# Patient Record
Sex: Female | Born: 1975 | Race: White | Hispanic: No | Marital: Single | Smoking: Former smoker
Health system: Southern US, Community
[De-identification: ages and names within clinical notes are randomized; demographics above are authoritative.]

## PROBLEM LIST (undated history)

## (undated) DIAGNOSIS — K219 Gastro-esophageal reflux disease without esophagitis: Secondary | ICD-10-CM

## (undated) DIAGNOSIS — Z8669 Personal history of other diseases of the nervous system and sense organs: Secondary | ICD-10-CM

## (undated) DIAGNOSIS — T7840XA Allergy, unspecified, initial encounter: Secondary | ICD-10-CM

## (undated) DIAGNOSIS — Z8619 Personal history of other infectious and parasitic diseases: Secondary | ICD-10-CM

## (undated) HISTORY — DX: Personal history of other infectious and parasitic diseases: Z86.19

## (undated) HISTORY — DX: Personal history of other diseases of the nervous system and sense organs: Z86.69

## (undated) HISTORY — DX: Gastro-esophageal reflux disease without esophagitis: K21.9

## (undated) HISTORY — PX: WISDOM TOOTH EXTRACTION: SHX21

## (undated) HISTORY — DX: Allergy, unspecified, initial encounter: T78.40XA

---

## 1999-08-02 ENCOUNTER — Other Ambulatory Visit: Admission: RE | Admit: 1999-08-02 | Discharge: 1999-08-02 | Payer: Self-pay | Admitting: Obstetrics and Gynecology

## 2000-09-06 ENCOUNTER — Other Ambulatory Visit: Admission: RE | Admit: 2000-09-06 | Discharge: 2000-09-06 | Payer: Self-pay | Admitting: Obstetrics and Gynecology

## 2001-10-17 ENCOUNTER — Other Ambulatory Visit: Admission: RE | Admit: 2001-10-17 | Discharge: 2001-10-17 | Payer: Self-pay | Admitting: Obstetrics and Gynecology

## 2003-01-02 ENCOUNTER — Other Ambulatory Visit: Admission: RE | Admit: 2003-01-02 | Discharge: 2003-01-02 | Payer: Self-pay | Admitting: Obstetrics and Gynecology

## 2004-02-03 ENCOUNTER — Other Ambulatory Visit: Admission: RE | Admit: 2004-02-03 | Discharge: 2004-02-03 | Payer: Self-pay | Admitting: Obstetrics and Gynecology

## 2005-10-25 ENCOUNTER — Observation Stay: Payer: Self-pay | Admitting: Obstetrics and Gynecology

## 2006-01-04 ENCOUNTER — Observation Stay: Payer: Self-pay | Admitting: Obstetrics and Gynecology

## 2006-01-08 ENCOUNTER — Observation Stay: Payer: Self-pay | Admitting: Obstetrics and Gynecology

## 2006-01-10 ENCOUNTER — Inpatient Hospital Stay: Payer: Self-pay

## 2006-03-10 ENCOUNTER — Emergency Department: Payer: Self-pay | Admitting: Emergency Medicine

## 2007-08-18 ENCOUNTER — Emergency Department: Payer: Self-pay | Admitting: Internal Medicine

## 2007-08-21 ENCOUNTER — Emergency Department: Payer: Self-pay | Admitting: Emergency Medicine

## 2008-11-02 ENCOUNTER — Emergency Department: Payer: Self-pay | Admitting: Emergency Medicine

## 2010-03-15 ENCOUNTER — Emergency Department: Payer: Self-pay | Admitting: Emergency Medicine

## 2012-08-19 LAB — LIPID PANEL
CHOLESTEROL: 154 (ref 0–200)
HDL: 47 (ref 35–70)
LDL CALC: 78
LDl/HDL Ratio: 1.7
Triglycerides: 146 (ref 40–160)

## 2013-07-02 ENCOUNTER — Emergency Department: Payer: Self-pay | Admitting: Emergency Medicine

## 2013-11-13 ENCOUNTER — Ambulatory Visit: Payer: Self-pay | Admitting: Obstetrics and Gynecology

## 2014-03-21 ENCOUNTER — Emergency Department: Payer: Self-pay | Admitting: Emergency Medicine

## 2014-03-21 LAB — URINALYSIS, COMPLETE
Bilirubin,UR: NEGATIVE
Glucose,UR: NEGATIVE mg/dL (ref 0–75)
KETONE: NEGATIVE
Leukocyte Esterase: NEGATIVE
Nitrite: NEGATIVE
Ph: 6 (ref 4.5–8.0)
Protein: NEGATIVE
RBC,UR: 5 /HPF (ref 0–5)
Specific Gravity: 1.012 (ref 1.003–1.030)
Squamous Epithelial: 2
WBC UR: 1 /HPF (ref 0–5)

## 2014-03-21 LAB — COMPREHENSIVE METABOLIC PANEL
ALBUMIN: 3.1 g/dL — AB (ref 3.4–5.0)
ALK PHOS: 61 U/L
Anion Gap: 7 (ref 7–16)
BILIRUBIN TOTAL: 0.6 mg/dL (ref 0.2–1.0)
BUN: 11 mg/dL (ref 7–18)
CHLORIDE: 107 mmol/L (ref 98–107)
Calcium, Total: 8.1 mg/dL — ABNORMAL LOW (ref 8.5–10.1)
Co2: 26 mmol/L (ref 21–32)
Creatinine: 0.72 mg/dL (ref 0.60–1.30)
EGFR (African American): >60
GLUCOSE: 83 mg/dL (ref 65–99)
Osmolality: 278 (ref 275–301)
POTASSIUM: 3.9 mmol/L (ref 3.5–5.1)
SGOT(AST): 18 U/L (ref 15–37)
SGPT (ALT): 17 U/L
Sodium: 140 mmol/L (ref 136–145)
TOTAL PROTEIN: 7.1 g/dL (ref 6.4–8.2)

## 2014-03-21 LAB — CBC WITH DIFFERENTIAL/PLATELET
Basophil #: 0 10*3/uL (ref 0.0–0.1)
Basophil %: 0.4 %
EOS ABS: 0 10*3/uL (ref 0.0–0.7)
EOS PCT: 0.3 %
HCT: 37.3 % (ref 35.0–47.0)
HGB: 12.1 g/dL (ref 12.0–16.0)
LYMPHS PCT: 13 %
Lymphocyte #: 1.4 10*3/uL (ref 1.0–3.6)
MCH: 25.7 pg — ABNORMAL LOW (ref 26.0–34.0)
MCHC: 32.4 g/dL (ref 32.0–36.0)
MCV: 79 fL — AB (ref 80–100)
Monocyte #: 0.9 x10 3/mm (ref 0.2–0.9)
Monocyte %: 8.2 %
Neutrophil #: 8.7 10*3/uL — ABNORMAL HIGH (ref 1.4–6.5)
Neutrophil %: 78.1 %
Platelet: 266 10*3/uL (ref 150–440)
RBC: 4.69 10*6/uL (ref 3.80–5.20)
RDW: 14.6 % — AB (ref 11.5–14.5)
WBC: 11.1 10*3/uL — AB (ref 3.6–11.0)

## 2014-03-21 LAB — HCG, QUANTITATIVE, PREGNANCY: Beta Hcg, Quant.: 131356 m[IU]/mL — ABNORMAL HIGH

## 2014-03-21 LAB — PREGNANCY, URINE: Pregnancy Test, Urine: POSITIVE m[IU]/mL

## 2015-02-21 ENCOUNTER — Emergency Department: Payer: BLUE CROSS/BLUE SHIELD

## 2015-02-21 ENCOUNTER — Emergency Department
Admission: EM | Admit: 2015-02-21 | Discharge: 2015-02-21 | Disposition: A | Discharge status: Home / Self Care | Payer: BLUE CROSS/BLUE SHIELD | Attending: Emergency Medicine | Admitting: Emergency Medicine

## 2015-02-21 ENCOUNTER — Encounter: Payer: Self-pay | Admitting: Emergency Medicine

## 2015-02-21 DIAGNOSIS — M545 Low back pain: Secondary | ICD-10-CM | POA: Diagnosis present

## 2015-02-21 DIAGNOSIS — M533 Sacrococcygeal disorders, not elsewhere classified: Secondary | ICD-10-CM | POA: Diagnosis not present

## 2015-02-21 DIAGNOSIS — Z3202 Encounter for pregnancy test, result negative: Secondary | ICD-10-CM | POA: Insufficient documentation

## 2015-02-21 LAB — POCT PREGNANCY, URINE: PREG TEST UR: NEGATIVE

## 2015-02-21 MED ORDER — KETOROLAC TROMETHAMINE 60 MG/2ML IM SOLN
60.0000 mg | Freq: Once | INTRAMUSCULAR | Status: AC
  Administered 2015-02-21: 60 mg via INTRAMUSCULAR
  Filled 2015-02-21: qty 2

## 2015-02-21 MED ORDER — METHOCARBAMOL 500 MG PO TABS: 500.0000 mg | ORAL_TABLET | Freq: Four times a day (QID) | ORAL | Status: DC | PRN

## 2015-02-21 MED ORDER — DICLOFENAC SODIUM 75 MG PO TBEC: 75.0000 mg | DELAYED_RELEASE_TABLET | Freq: Two times a day (BID) | ORAL | Status: DC

## 2015-02-21 NOTE — ED Notes (Signed)
Patient to ED with report of lower back pain worsening over the last couple of weeks. Patient reports injuring back about 2 years ago.

## 2015-02-21 NOTE — ED Provider Notes (Signed)
[facility] Emergency Department Provider Note  ____________________________________________  Time seen: Approximately 11:28 AM  I have reviewed the triage vital signs and the nursing notes.   HISTORY  Chief Complaint Back Pain    HPI Megan Sexton is a 39 y.o. female who presents for evaluation of low back pain worsening over the last couple weeks. Patient states that she injured approximately 2 years ago because never had x-rays. States pain is right at the top of the buttocks at the crack.  No past medical history on file.  There are no active problems to display for this patient.   No past surgical history on file.  Current Outpatient Rx  Name  Route  Sig  Dispense  Refill  . diclofenac (VOLTAREN) 75 MG EC tablet   Oral   Take 1 tablet (75 mg total) by mouth 2 (two) times daily.   60 tablet   0   . methocarbamol (ROBAXIN) 500 MG tablet   Oral   Take 1 tablet (500 mg total) by mouth every 6 (six) hours as needed for muscle spasms.   30 tablet   0     Allergies Codeine  No family history on file.  Social History Social History  Substance Use Topics  . Smoking status: Never Smoker   . Smokeless tobacco: Not on file  . Alcohol Use: No    Review of Systems Constitutional: No fever/chills Eyes: No visual changes. ENT: No sore throat. Cardiovascular: Denies chest pain. Respiratory: Denies shortness of breath. Gastrointestinal: No abdominal pain.  No nausea, no vomiting.  No diarrhea.  No constipation. Genitourinary: Negative for dysuria. Musculoskeletal: Positive for low back pain located around the sacrum coccyx area Skin: Negative for rash. Neurological: Negative for headaches, focal weakness or numbness.  10-point ROS otherwise negative.  ____________________________________________   PHYSICAL EXAM:  VITAL SIGNS: ED Triage Vitals  Enc Vitals Group     BP --      Pulse Rate 02/21/15 1057 92     Resp 02/21/15 1057  20     Temp 02/21/15 1057 98 F (36.7 C)     Temp Source 02/21/15 1057 Oral     SpO2 02/21/15 1057 100 %     Weight 02/21/15 1057 220 lb (99.791 kg)     Height 02/21/15 1057  (1.727 m)     Head Cir --      Peak Flow --      Pain Score 02/21/15 1100 8     Pain Loc --      Pain Edu? --      Excl. in GC? --     Constitutional: Alert and oriented. Well appearing and in no acute distress. Cardiovascular: Normal rate, regular rhythm. Grossly normal heart sounds.  Good peripheral circulation. Respiratory: Normal respiratory effort.  No retractions. Lungs CTAB. Gastrointestinal: Soft and nontender. No distention. No abdominal bruits. No CVA tenderness. Musculoskeletal: No lower extremity tenderness nor edema.  No joint effusions. Neurologic:  Normal speech and language. No gross focal neurologic deficits are appreciated. No gait instability. Skin:  Skin is warm, dry and intact. No rash noted. Psychiatric: Mood and affect are normal. Speech and behavior are normal.  ____________________________________________   LABS (all labs ordered are listed, but only abnormal results are displayed)  Labs Reviewed  POC URINE PREG, ED  POCT PREGNANCY, URINE    RADIOLOGY  Mild DJD negative for fracture dislocation or mass. ____________________________________________   PROCEDURES  Procedure(s) performed: None  Critical Care performed: No  ____________________________________________   INITIAL IMPRESSION / ASSESSMENT AND PLAN / ED COURSE  Pertinent labs & imaging results that were available during my care of the patient were reviewed by me and considered in my medical decision making (see chart for details).  Chronic low back pain with some radiation. Rx given for Robaxin and Voltaren. Patient to follow up with PCP for chronic pain management referral. ____________________________________________   FINAL CLINICAL IMPRESSION(S) / ED DIAGNOSES  Final diagnoses:  Sacral back  pain      Evangeline Dakin, PA-C 02/21/15 1422  Phineas Semen, MD 02/21/15 1453

## 2015-02-21 NOTE — Discharge Instructions (Signed)
Back Pain, Adult °Back pain is very common. The pain often gets better over time. The cause of back pain is usually not dangerous. Most people can learn to manage their back pain on their own.  °HOME CARE  °· Stay active. Start with short walks on flat ground if you can. Try to walk farther each day. °· Do not sit, drive, or stand in one place for more than 30 minutes. Do not stay in bed. °· Do not avoid exercise or work. Activity can help your back heal faster. °· Be careful when you bend or lift an object. Bend at your knees, keep the object close to you, and do not twist. °· Sleep on a firm mattress. Lie on your side, and bend your knees. If you lie on your back, put a pillow under your knees. °· Only take medicines as told by your doctor. °· Put ice on the injured area. °¨ Put ice in a plastic bag. °¨ Place a towel between your skin and the bag. °¨ Leave the ice on for 15-20 minutes, 03-04 times a day for the first 2 to 3 days. After that, you can switch between ice and heat packs. °· Ask your doctor about back exercises or massage. °· Avoid feeling anxious or stressed. Find good ways to deal with stress, such as exercise. °GET HELP RIGHT AWAY IF:  °· Your pain does not go away with rest or medicine. °· Your pain does not go away in 1 week. °· You have new problems. °· You do not feel well. °· The pain spreads into your legs. °· You cannot control when you poop (bowel movement) or pee (urinate). °· Your arms or legs feel weak or lose feeling (numbness). °· You feel sick to your stomach (nauseous) or throw up (vomit). °· You have belly (abdominal) pain. °· You feel like you may pass out (faint). °MAKE SURE YOU:  °· Understand these instructions. °· Will watch your condition. °· Will get help right away if you are not doing well or get worse. °Document Released: 11/15/2007 Document Revised: 08/21/2011 Document Reviewed: 09/30/2013 °Exit[employer]® Patient Information ©2015 [employer]. This information is not intended  to replace advice given to you by your health care provider. Make sure you discuss any questions you have with your health care provider. ° °Musculoskeletal Pain °Musculoskeletal pain is muscle and boney aches and pains. These pains can occur in any part of the body. Your caregiver may treat you without knowing the cause of the pain. They may treat you if blood or urine tests, X-rays, and other tests were normal.  °CAUSES °There is often not a definite cause or reason for these pains. These pains may be caused by a type of germ (virus). The discomfort may also come from overuse. Overuse includes working out too hard when your body is not fit. Boney aches also come from weather changes. Bone is sensitive to atmospheric pressure changes. °HOME CARE INSTRUCTIONS  °· Ask when your test results will be ready. Make sure you get your test results. °· Only take over-the-counter or prescription medicines for pain, discomfort, or fever as directed by your caregiver. If you were given medications for your condition, do not drive, operate machinery or power tools, or sign legal documents for 24 hours. Do not drink alcohol. Do not take sleeping pills or other medications that may interfere with treatment. °· Continue all activities unless the activities cause more pain. When the pain lessens, slowly resume normal activities.   Gradually increase the intensity and duration of the activities or exercise. °· During periods of severe pain, bed rest may be helpful. Lay or sit in any position that is comfortable. °· Putting ice on the injured area. °¨ Put ice in a bag. °¨ Place a towel between your skin and the bag. °¨ Leave the ice on for 15 to 20 minutes, 3 to 4 times a day. °· Follow up with your caregiver for continued problems and no reason can be found for the pain. If the pain becomes worse or does not go away, it may be necessary to repeat tests or do additional testing. Your caregiver may need to look further for a possible  cause. °SEEK IMMEDIATE MEDICAL CARE IF: °· You have pain that is getting worse and is not relieved by medications. °· You develop chest pain that is associated with shortness or breath, sweating, feeling sick to your stomach (nauseous), or throw up (vomit). °· Your pain becomes localized to the abdomen. °· You develop any new symptoms that seem different or that concern you. °MAKE SURE YOU:  °· Understand these instructions. °· Will watch your condition. °· Will get help right away if you are not doing well or get worse. °Document Released: 05/29/2005 Document Revised: 08/21/2011 Document Reviewed: 01/31/2013 °ExitCare® Patient Information ©2015 [employer]. This information is not intended to replace advice given to you by your health care provider. Make sure you discuss any questions you have with your health care provider. ° °

## 2017-05-09 LAB — HM PAP SMEAR: HM Pap smear: NEGATIVE

## 2018-05-22 ENCOUNTER — Other Ambulatory Visit: Payer: Self-pay | Admitting: Obstetrics and Gynecology

## 2018-05-22 DIAGNOSIS — Z1231 Encounter for screening mammogram for malignant neoplasm of breast: Secondary | ICD-10-CM

## 2018-06-17 ENCOUNTER — Ambulatory Visit
Admission: RE | Admit: 2018-06-17 | Discharge: 2018-06-17 | Disposition: A | Discharge status: Home / Self Care | Payer: BLUE CROSS/BLUE SHIELD | Source: Ambulatory Visit | Attending: Obstetrics and Gynecology | Admitting: Obstetrics and Gynecology

## 2018-06-17 ENCOUNTER — Encounter: Payer: Self-pay | Admitting: Radiology

## 2018-06-17 DIAGNOSIS — Z1231 Encounter for screening mammogram for malignant neoplasm of breast: Secondary | ICD-10-CM | POA: Diagnosis present

## 2018-07-01 ENCOUNTER — Ambulatory Visit (INDEPENDENT_AMBULATORY_CARE_PROVIDER_SITE_OTHER): Payer: BLUE CROSS/BLUE SHIELD | Admitting: Family Medicine

## 2018-07-01 ENCOUNTER — Encounter: Payer: Self-pay | Admitting: Family Medicine

## 2018-07-01 VITALS — BP 122/78 | HR 65 | Temp 98.7°F | Ht 68.5 in | Wt 220.8 lb

## 2018-07-01 DIAGNOSIS — R6889 Other general symptoms and signs: Secondary | ICD-10-CM | POA: Diagnosis not present

## 2018-07-01 DIAGNOSIS — Z13220 Encounter for screening for lipoid disorders: Secondary | ICD-10-CM

## 2018-07-01 DIAGNOSIS — E663 Overweight: Secondary | ICD-10-CM | POA: Diagnosis not present

## 2018-07-01 DIAGNOSIS — Z131 Encounter for screening for diabetes mellitus: Secondary | ICD-10-CM | POA: Diagnosis not present

## 2018-07-01 DIAGNOSIS — R209 Unspecified disturbances of skin sensation: Secondary | ICD-10-CM

## 2018-07-01 NOTE — Progress Notes (Signed)
Subjective:     Megan Sexton is a 43 y.o. female presenting for Establish Care (previous PCP was Dr. Don Broach. ) and Toes and nose feel very cold (not as bad in the summer. This does not happen every day but does happen often. Fingers do get some tingling sensation in them.)     HPI   #cold extremities  - started last year - mainly happening in the cold weather, but also will happen inside - mainly nose and toes - fingers will feel tingly - very cold, but no pain - and takes a while to warm up - will wear thicker socks - which may help - no skin changes - may look red, but no whitening of the skin  Does a lot of cleaning at work with dust and uses more eye drops   Review of Systems  Constitutional: Negative for chills and fever.  HENT: Negative for congestion and rhinorrhea.   Eyes: Negative for itching and visual disturbance.  Respiratory: Negative for chest tightness and shortness of breath.   Cardiovascular: Negative for chest pain, palpitations and leg swelling.  Gastrointestinal: Negative for abdominal distention, diarrhea, nausea and vomiting.  Endocrine: Negative for cold intolerance, heat intolerance, polydipsia and polyuria.  Genitourinary: Negative for difficulty urinating and dysuria.  Musculoskeletal: Negative for arthralgias and joint swelling.  Skin: Negative for color change and rash.  Allergic/Immunologic: Negative for immunocompromised state.  Neurological: Negative for dizziness, numbness and headaches.  Hematological: Negative for adenopathy.  Psychiatric/Behavioral: The patient is not nervous/anxious.      Social History   Tobacco Use  Smoking Status Former Smoker  . Packs/day: 0.25  . Years: 10.00  . Pack years: 2.50  . Types: Cigarettes  . Last attempt to quit: 06/12/2016  . Years since quitting: 2.0  Smokeless Tobacco Never Used        Objective:    BP Readings from Last 3 Encounters:  07/01/18 122/78   Wt Readings from Last 3  Encounters:  07/01/18 220 lb 12 oz (100.1 kg)  02/21/15 220 lb (99.8 kg)    BP 122/78   Pulse 65   Temp 98.7 F (37.1 C)   Ht 5' 8.5" (1.74 m)   Wt 220 lb 12 oz (100.1 kg)   LMP 06/22/2018   SpO2 98%   BMI 33.08 kg/m    Physical Exam Constitutional:      General: She is not in acute distress.    Appearance: She is well-developed. She is not diaphoretic.  HENT:     Right Ear: External ear normal.     Left Ear: External ear normal.     Nose: Nose normal.  Eyes:     Conjunctiva/sclera: Conjunctivae normal.  Neck:     Musculoskeletal: Neck supple.  Cardiovascular:     Rate and Rhythm: Normal rate and regular rhythm.     Heart sounds: No murmur.  Pulmonary:     Effort: Pulmonary effort is normal. No respiratory distress.     Breath sounds: Normal breath sounds. No wheezing.  Skin:    General: Skin is warm and dry.     Capillary Refill: Capillary refill takes less than 2 seconds.     Findings: No erythema.     Comments: Fingers and toes without ulcers. Normal culture. Normal cap refill.   Neurological:     Mental Status: She is alert. Mental status is at baseline.  Psychiatric:        Mood and Affect: Mood normal.  Behavior: Behavior normal.           Assessment & Plan:   Problem List Items Addressed This Visit      Other   Overweight - Primary    Occasionally taking phentermine      Relevant Orders   Lipid panel   Basic metabolic panel   Hemoglobin A1c   Cold extremities    No hx/fmhx of autoimmune disorder. Hx does not seem consistent with Raynaud phenomenon either. Suspect this may be normal physiological response. Warm clothes/socks and continue to monitor       Other Visit Diagnoses    Screening for hyperlipidemia       Relevant Orders   Lipid panel   Screening for diabetes mellitus       Relevant Orders   Basic metabolic panel   Hemoglobin A1c       Return in about 1 year (around 07/02/2019).  Lynnda ChildJessica R Cody, MD

## 2018-07-01 NOTE — Patient Instructions (Addendum)
Cold extremities - wear thick socks  - consider wearing hat/gloves and face warmer if able at work

## 2018-07-01 NOTE — Assessment & Plan Note (Signed)
Occasionally taking phentermine

## 2018-07-01 NOTE — Assessment & Plan Note (Signed)
No hx/fmhx of autoimmune disorder. Hx does not seem consistent with Raynaud phenomenon either. Suspect this may be normal physiological response. Warm clothes/socks and continue to monitor

## 2019-03-19 ENCOUNTER — Other Ambulatory Visit: Payer: Self-pay | Admitting: Family Medicine

## 2019-03-19 NOTE — Telephone Encounter (Signed)
This patient will need to wait for Dr. Verda Cumins return in a few weeks for medication refill. I do not see anywhere in Dr. Verda Cumins notes that mentions she should continue.

## 2019-03-19 NOTE — Telephone Encounter (Signed)
Pt is requesting refill on phentermine.  [place] [phone]

## 2019-03-19 NOTE — Telephone Encounter (Signed)
Left message to advise patient

## 2019-03-19 NOTE — Telephone Encounter (Signed)
Dr Einar Pheasant has not had to fill this medication for the patient yet. LOV 06/21/2018 for New patient appointment. Please review. Thank you.

## 2019-03-20 NOTE — Telephone Encounter (Signed)
Patient works third shift and she's requesting Megan Sexton Course leave a detailed message on her voice mal [phone].

## 2019-03-20 NOTE — Telephone Encounter (Signed)
Patient advised. Sending for review to Dr Einar Pheasant upon her return

## 2019-03-20 NOTE — Telephone Encounter (Signed)
Best number [phone] Pt returned your call

## 2019-03-21 ENCOUNTER — Other Ambulatory Visit (INDEPENDENT_AMBULATORY_CARE_PROVIDER_SITE_OTHER): Payer: BC Managed Care – PPO

## 2019-03-21 ENCOUNTER — Other Ambulatory Visit: Payer: Self-pay

## 2019-03-21 DIAGNOSIS — Z13220 Encounter for screening for lipoid disorders: Secondary | ICD-10-CM | POA: Diagnosis not present

## 2019-03-21 DIAGNOSIS — E663 Overweight: Secondary | ICD-10-CM | POA: Diagnosis not present

## 2019-03-21 DIAGNOSIS — Z131 Encounter for screening for diabetes mellitus: Secondary | ICD-10-CM | POA: Diagnosis not present

## 2019-03-21 DIAGNOSIS — Z113 Encounter for screening for infections with a predominantly sexual mode of transmission: Secondary | ICD-10-CM

## 2019-03-21 LAB — LIPID PANEL
Cholesterol: 141 mg/dL (ref 0–200)
HDL: 60.7 mg/dL (ref >39.00)
LDL Cholesterol: 67 mg/dL (ref 0–99)
NonHDL: 80.06
Total CHOL/HDL Ratio: 2
Triglycerides: 64 mg/dL (ref 0.0–149.0)
VLDL: 12.8 mg/dL (ref 0.0–40.0)

## 2019-03-21 LAB — BASIC METABOLIC PANEL
BUN: 13 mg/dL (ref 6–23)
CO2: 27 mEq/L (ref 19–32)
Calcium: 9 mg/dL (ref 8.4–10.5)
Chloride: 102 mEq/L (ref 96–112)
Creatinine, Ser: 0.77 mg/dL (ref 0.40–1.20)
GFR: 82.56 mL/min (ref >60.00)
Glucose, Bld: 88 mg/dL (ref 70–99)
Potassium: 3.4 mEq/L — ABNORMAL LOW (ref 3.5–5.1)
Sodium: 137 mEq/L (ref 135–145)

## 2019-03-21 LAB — HEMOGLOBIN A1C: Hgb A1c MFr Bld: 5.7 % (ref 4.6–6.5)

## 2019-03-21 NOTE — Addendum Note (Signed)
Addended by: Ellamae Sia on: 03/21/2019 09:52 AM   Modules accepted: Orders

## 2019-03-24 LAB — HIV ANTIBODY (ROUTINE TESTING W REFLEX): HIV 1&2 Ab, 4th Generation: NONREACTIVE

## 2019-03-24 LAB — RPR: RPR Ser Ql: NONREACTIVE

## 2019-03-26 ENCOUNTER — Telehealth: Payer: Self-pay

## 2019-03-26 NOTE — Telephone Encounter (Signed)
Left message for patient to call back to discuss results

## 2019-03-27 NOTE — Telephone Encounter (Signed)
Patient would like to know if her recent lab results could be printed for her to pick up  She would like a call when these are ready

## 2019-03-27 NOTE — Telephone Encounter (Signed)
Left message for patient letting her know that lab results are placed up front for pick up

## 2019-04-08 MED ORDER — PHENTERMINE HCL 37.5 MG PO TABS: ORAL_TABLET | ORAL | 1 refills | Status: AC

## 2019-05-14 ENCOUNTER — Ambulatory Visit (INDEPENDENT_AMBULATORY_CARE_PROVIDER_SITE_OTHER): Payer: BC Managed Care – PPO | Admitting: Family Medicine

## 2019-05-14 ENCOUNTER — Other Ambulatory Visit: Payer: Self-pay

## 2019-05-14 ENCOUNTER — Encounter: Payer: Self-pay | Admitting: Family Medicine

## 2019-05-14 VITALS — BP 108/82 | HR 75 | Temp 98.1°F | Ht 68.5 in | Wt 208.5 lb

## 2019-05-14 DIAGNOSIS — R7303 Prediabetes: Secondary | ICD-10-CM | POA: Insufficient documentation

## 2019-05-14 DIAGNOSIS — Z3009 Encounter for other general counseling and advice on contraception: Secondary | ICD-10-CM | POA: Diagnosis not present

## 2019-05-14 DIAGNOSIS — S39011A Strain of muscle, fascia and tendon of abdomen, initial encounter: Secondary | ICD-10-CM | POA: Diagnosis not present

## 2019-05-14 DIAGNOSIS — M545 Low back pain, unspecified: Secondary | ICD-10-CM

## 2019-05-14 MED ORDER — CYCLOBENZAPRINE HCL 5 MG PO TABS: 5.0000 mg | ORAL_TABLET | Freq: Three times a day (TID) | ORAL | 1 refills | Status: AC | PRN

## 2019-05-14 NOTE — Progress Notes (Signed)
Subjective:     Megan Sexton is a 43 y.o. female presenting for Abdominal Pain (x 2 weeks. Off and on. LLQ. ) and Contraception (would like to get IUD.)     HPI  #Abdominal pain - started 2 weeks ago - worse with movement - sharp pain in the lower pelvis area - improving with time - normal BM, one time a day - soft, does not strain - treatment: ibuprofen  - symptoms last 30-60 sec - worse with sneezing, quick movement - no vaginal discharge - no urinary symptom - frequency: improving, 1-2 times a day  #Contraception - previous IUD placed without issue - not currently doing anything for birth control - does not think she is pregnant  #low back pain - does not radiate - has been going on since Sunday - constant pain - wondering if a muscle relaxant can help - taking ibuprofen   Review of Systems  Constitutional: Negative for chills and fever.  Gastrointestinal: Negative for constipation, diarrhea, nausea and vomiting.  Genitourinary: Negative for dysuria and vaginal discharge.  Neurological: Negative for weakness and numbness.     Social History   Tobacco Use  Smoking Status Former Smoker  . Packs/day: 0.25  . Years: 10.00  . Pack years: 2.50  . Types: Cigarettes  . Quit date: 06/12/2016  . Years since quitting: 2.9  Smokeless Tobacco Never Used        Objective:    BP Readings from Last 3 Encounters:  05/14/19 108/82  07/01/18 122/78   Wt Readings from Last 3 Encounters:  05/14/19 208 lb 8 oz (94.6 kg)  07/01/18 220 lb 12 oz (100.1 kg)  02/21/15 220 lb (99.8 kg)    BP 108/82   Pulse 75   Temp 98.1 F (36.7 C)   Ht 5' 8.5" (1.74 m)   Wt 208 lb 8 oz (94.6 kg)   LMP 05/03/2019   SpO2 100%   BMI 31.24 kg/m    Physical Exam Constitutional:      General: She is not in acute distress.    Appearance: She is well-developed. She is not diaphoretic.  HENT:     Right Ear: External ear normal.     Left Ear: External ear normal.     Nose:  Nose normal.  Eyes:     Conjunctiva/sclera: Conjunctivae normal.  Neck:     Musculoskeletal: Neck supple.  Cardiovascular:     Rate and Rhythm: Normal rate and regular rhythm.  Pulmonary:     Effort: Pulmonary effort is normal. No respiratory distress.     Breath sounds: Normal breath sounds. No wheezing.  Abdominal:     General: Abdomen is flat. Bowel sounds are normal. There is no distension.     Palpations: Abdomen is soft. There is no hepatomegaly.     Tenderness: There is no abdominal tenderness. There is no guarding or rebound. Negative signs include Murphy's sign.  Skin:    General: Skin is warm and dry.     Capillary Refill: Capillary refill takes less than 2 seconds.  Neurological:     Mental Status: She is alert. Mental status is at baseline.  Psychiatric:        Mood and Affect: Mood normal.        Behavior: Behavior normal.           Assessment & Plan:   Problem List Items Addressed This Visit      Musculoskeletal and Integument   Strain of  abdominal muscle - Primary    Symptoms most consistent with muscle strain which is improvement. Continue to monitor. Return if worsening        Other   Prediabetes    Discussed maintaining healthy weight and exercise. Offered metformin but patient declined. Monitor with annual labs.       Acute bilateral low back pain without sciatica    Brief discussion at end of visit and no time for exam. Though patient ambulating w/o difficulty. Trial of muscle relaxant      Relevant Medications   cyclobenzaprine (FLEXERIL) 5 MG tablet    Other Visit Diagnoses    Counseling for birth control regarding intrauterine device (IUD)         Pt would like to get an IUD discussed options of copper vs mirena and would like mirena. Will plan for procedure visit once the results come.   Return if symptoms worsen or fail to improve.  Lesleigh Noe, MD

## 2019-05-14 NOTE — Assessment & Plan Note (Signed)
Symptoms most consistent with muscle strain which is improvement. Continue to monitor. Return if worsening

## 2019-05-14 NOTE — Patient Instructions (Signed)
#  Abdominal pain - likely a pulled muscle - glad it is getting better - can continue to use ibuprofen as needed  #Prediabetes - continue regular exercise - work to avoid weight gain - weight loss is also helpful  #IUD - we will order a Mirena IUD for you - Our office will give you a call when it comes in

## 2019-05-14 NOTE — Assessment & Plan Note (Signed)
Brief discussion at end of visit and no time for exam. Though patient ambulating w/o difficulty. Trial of muscle relaxant

## 2019-05-14 NOTE — Assessment & Plan Note (Signed)
Discussed maintaining healthy weight and exercise. Offered metformin but patient declined. Monitor with annual labs.

## 2019-05-27 ENCOUNTER — Other Ambulatory Visit: Payer: Self-pay | Admitting: Family Medicine

## 2019-05-27 DIAGNOSIS — Z1231 Encounter for screening mammogram for malignant neoplasm of breast: Secondary | ICD-10-CM

## 2019-05-30 ENCOUNTER — Telehealth: Payer: Self-pay | Admitting: Family Medicine

## 2019-05-30 NOTE — Telephone Encounter (Signed)
Patient called.  She was told an IUD was going to be ordered and she hasn't heard anything back.  Please call and update patient.

## 2019-05-30 NOTE — Telephone Encounter (Signed)
Left message for patient and advised that we just got the IUD and I will check with Dr. Einar Pheasant to make sure we have everything we need for the procedure. I advised patient on the message that I would call her back when I knew for sure that we have everything that is needed.

## 2019-06-02 NOTE — Telephone Encounter (Signed)
Left message for patient to call back and schedule. Needs 1 hour appointment.

## 2019-06-02 NOTE — Telephone Encounter (Signed)
Ok to schedule for IUD placement in procedure room.

## 2019-06-02 NOTE — Telephone Encounter (Signed)
Patient scheduled.

## 2019-06-09 ENCOUNTER — Ambulatory Visit (INDEPENDENT_AMBULATORY_CARE_PROVIDER_SITE_OTHER): Payer: BC Managed Care – PPO | Admitting: Family Medicine

## 2019-06-09 ENCOUNTER — Encounter: Payer: Self-pay | Admitting: Family Medicine

## 2019-06-09 ENCOUNTER — Other Ambulatory Visit: Payer: Self-pay

## 2019-06-09 VITALS — BP 128/88 | HR 76 | Temp 98.2°F | Ht 68.5 in | Wt 207.1 lb

## 2019-06-09 DIAGNOSIS — Z30430 Encounter for insertion of intrauterine contraceptive device: Secondary | ICD-10-CM

## 2019-06-09 NOTE — Patient Instructions (Signed)
IUD PLACEMENT POST-PROCEDURE INSTRUCTIONS  1. You may take Ibuprofen, Aleve or Tylenol for pain if needed.  Cramping should resolve within in 24 hours.  2. You may have a small amount of spotting.  You should wear a mini pad for the next few days.  3. You may have intercourse after 24 hours.  If you using this for birth control, it is effective immediately.  4. You need to call if you have any pelvic pain, fever, heavy bleeding or foul smelling vaginal discharge.  Irregular bleeding is common the first several months after having an IUD placed. You do not need to call for this reason unless you are concerned.  5. Shower or bathe as normal  6. Optional: follow-up appointment in 4-8 weeks for a re-check to make sure you are not having any problems.

## 2019-06-09 NOTE — Progress Notes (Signed)
Error  Unable to do planned procedure due to not having all equipment necessary.

## 2019-07-03 ENCOUNTER — Ambulatory Visit
Admission: RE | Admit: 2019-07-03 | Discharge: 2019-07-03 | Disposition: A | Discharge status: Home / Self Care | Payer: BC Managed Care – PPO | Source: Ambulatory Visit | Attending: Family Medicine | Admitting: Family Medicine

## 2019-07-03 DIAGNOSIS — Z1231 Encounter for screening mammogram for malignant neoplasm of breast: Secondary | ICD-10-CM | POA: Diagnosis not present

## 2019-07-24 ENCOUNTER — Telehealth: Payer: Self-pay

## 2019-07-24 NOTE — Telephone Encounter (Signed)
Left detailed message on patient's voicemail (ok per DPR on file) We do not have the instrument to do IUD for the patient that was scheduled for 07/30/19. Not sure when it will be in. There was some unexpected delay/issues in getting this. I advised patient in the message that I would call her back to re schedule or if she wants to just be referred to gynecologist at this time that was ok also. I asked patient to call me back to let me know that she received my message and how she wants to proceed.

## 2019-07-30 ENCOUNTER — Ambulatory Visit: Payer: BC Managed Care – PPO | Admitting: Family Medicine

## 2019-08-11 NOTE — Telephone Encounter (Signed)
We have all the instruments needed for the procedure. Including the IUD medication. Left message for patient to call back to schedule at her convenience and to have this appointment scheduled for 1 hour.  Also advised patient that if she wanted to be referred to GYN instead that was fine and to let us know

## 2020-11-17 ENCOUNTER — Other Ambulatory Visit: Payer: Self-pay | Admitting: Obstetrics and Gynecology

## 2020-11-17 DIAGNOSIS — Z1231 Encounter for screening mammogram for malignant neoplasm of breast: Secondary | ICD-10-CM

## 2020-12-10 ENCOUNTER — Ambulatory Visit
Admission: RE | Admit: 2020-12-10 | Discharge: 2020-12-10 | Disposition: A | Discharge status: Home / Self Care | Payer: BC Managed Care – PPO | Source: Ambulatory Visit | Attending: Obstetrics and Gynecology | Admitting: Obstetrics and Gynecology

## 2020-12-10 ENCOUNTER — Other Ambulatory Visit: Payer: Self-pay

## 2020-12-10 DIAGNOSIS — Z1231 Encounter for screening mammogram for malignant neoplasm of breast: Secondary | ICD-10-CM | POA: Diagnosis present

## 2021-10-05 IMAGING — MG MM DIGITAL SCREENING BILAT W/ TOMO AND CAD
8 series · 8 of 24 positions shown · non-contrast
Comparison: Previous exam(s).

CLINICAL DATA: Screening.

EXAM:
DIGITAL SCREENING BILATERAL MAMMOGRAM WITH TOMOSYNTHESIS AND CAD
TECHNIQUE: Bilateral screening digital craniocaudal and mediolateral oblique
mammograms were obtained. Bilateral screening digital breast
tomosynthesis was performed. The images were evaluated with
computer-aided detection.

[L MLO synth-2D]
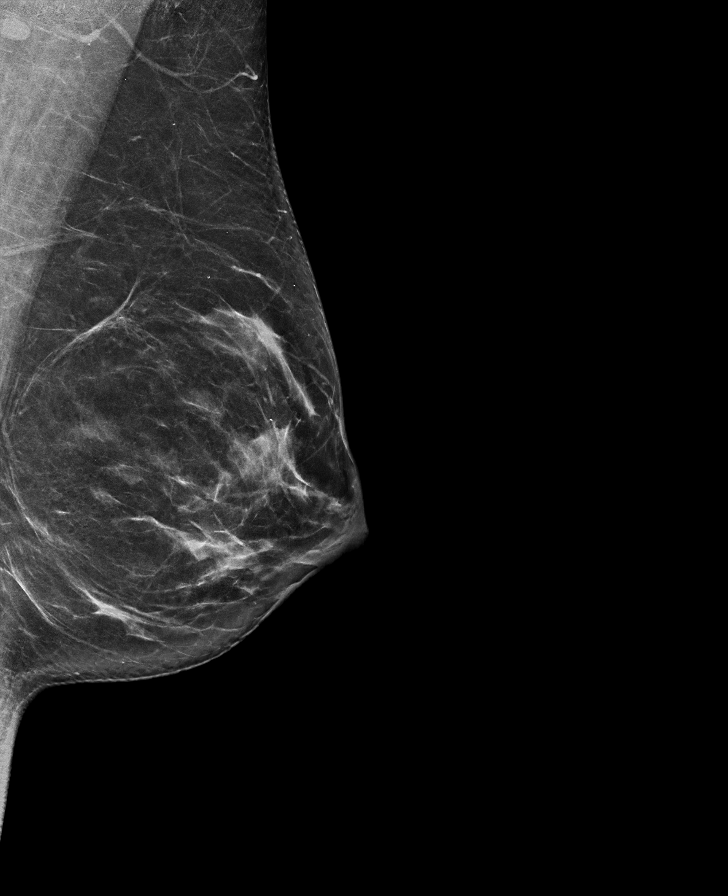

[R MLO synth-2D]
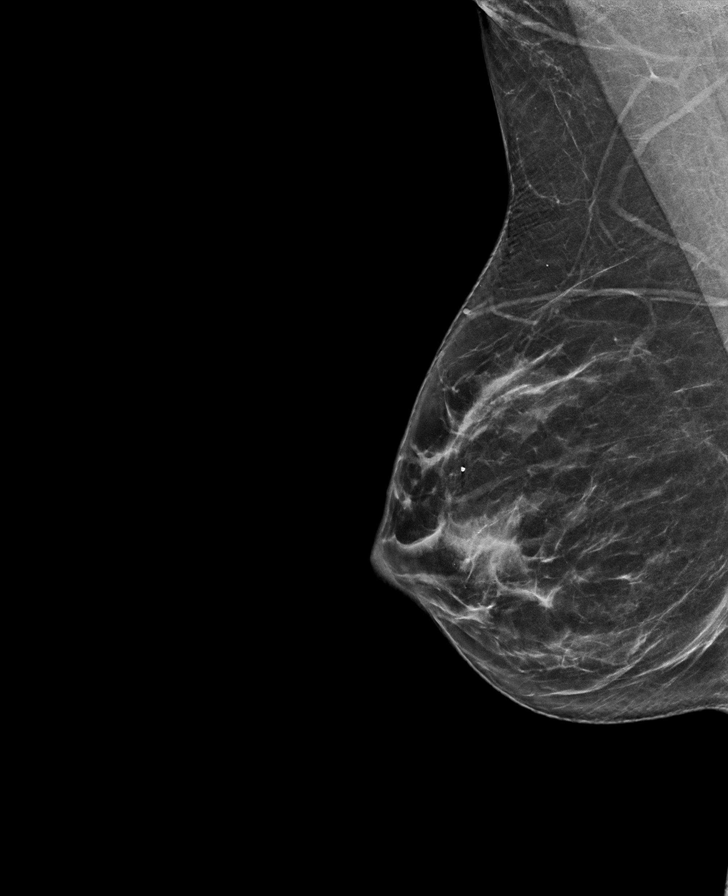

[R CC synth-2D]
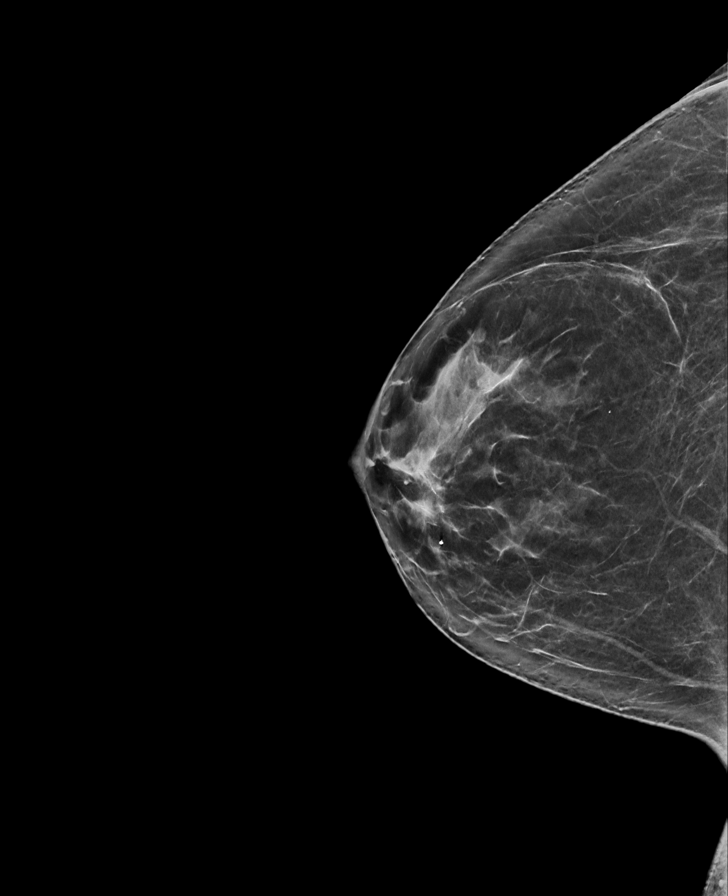

[L CC synth-2D]
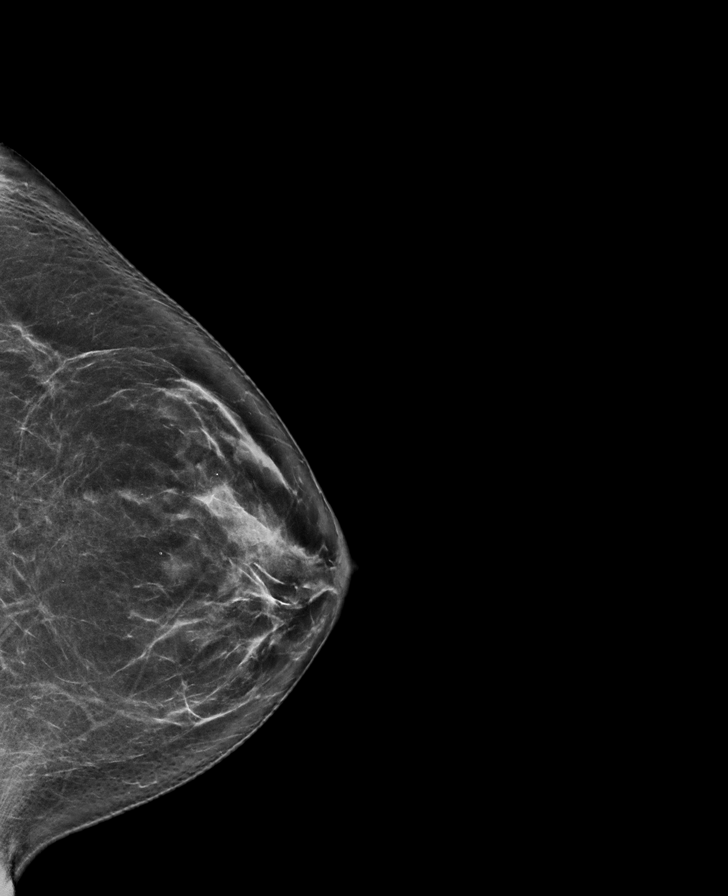

[L CC tomo · tomo slice 39/76.0]
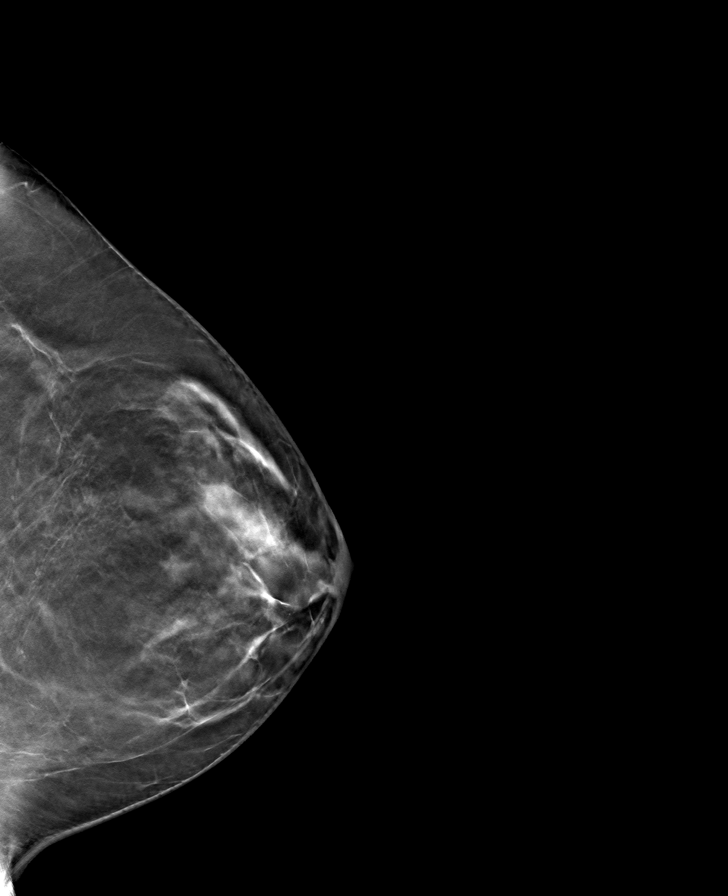

[L MLO tomo · tomo slice 39/78.0]
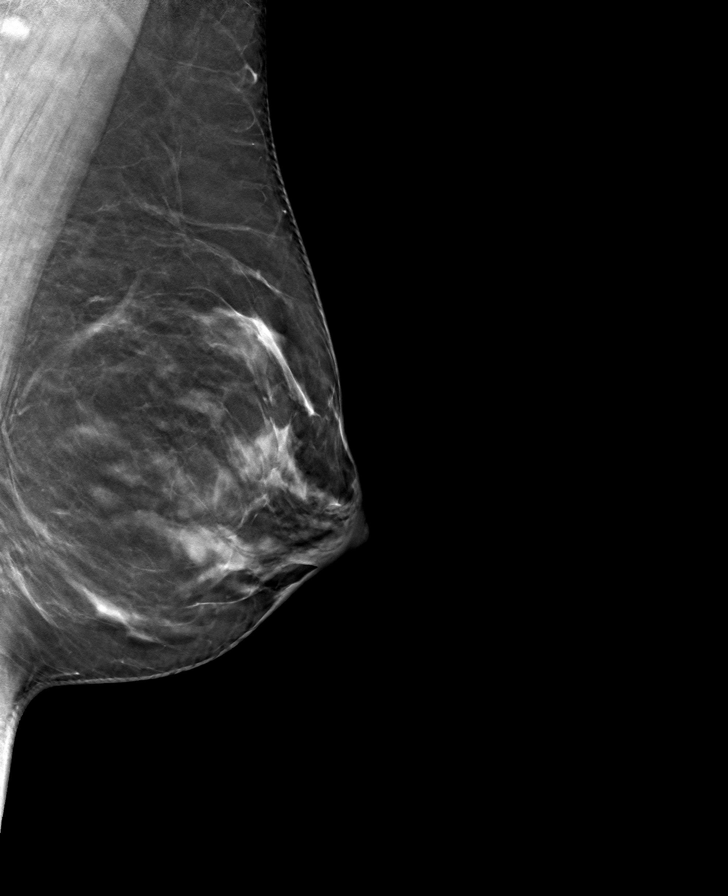

[R MLO tomo · tomo slice 36/71.0]
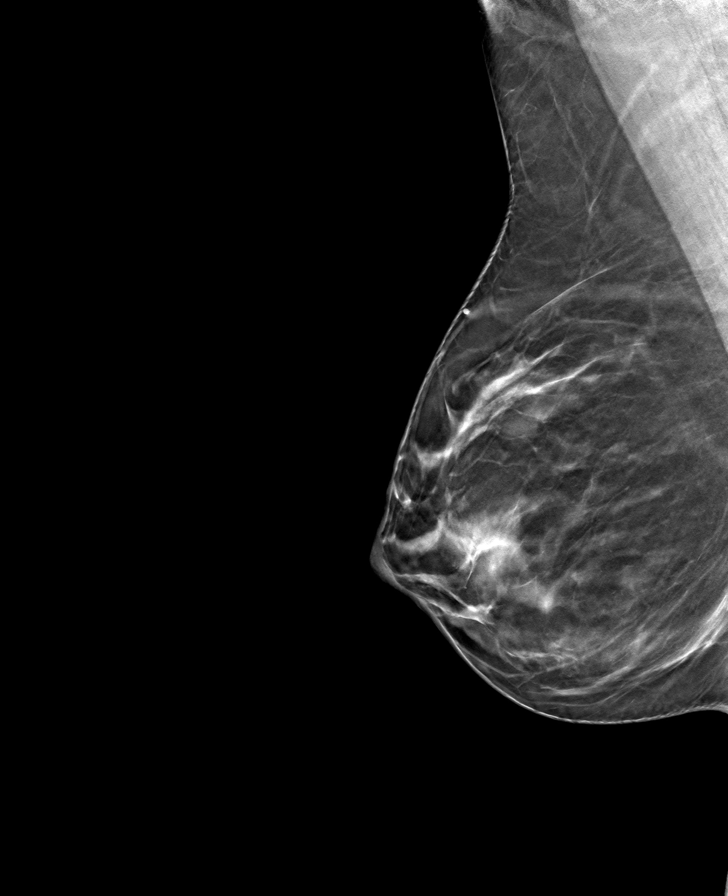

[R CC tomo · tomo slice 37/72.0]
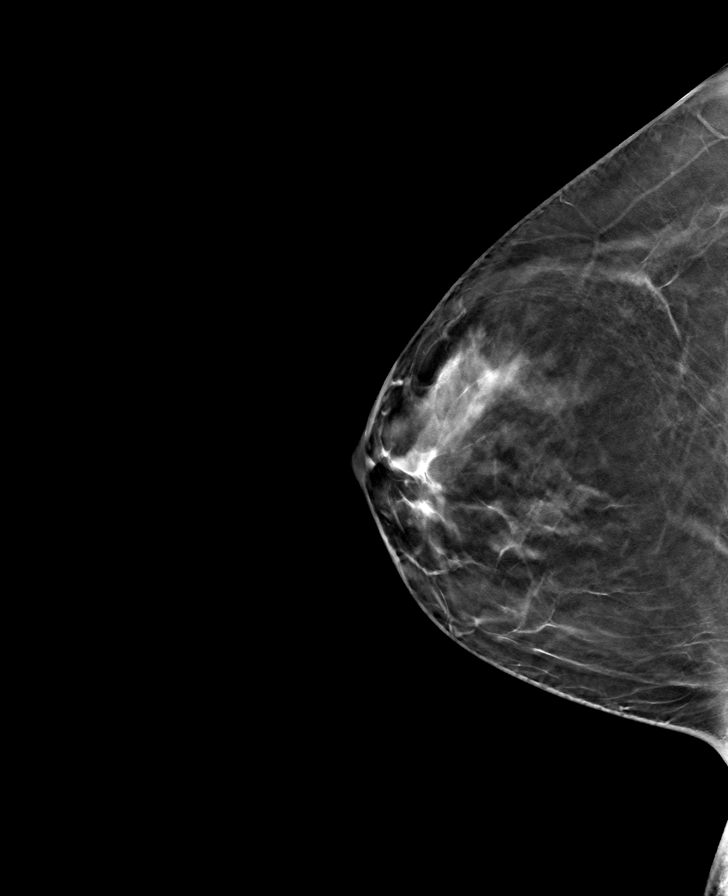

[8 of 24 positions shown; findings below may reference images not displayed]

ACR Breast Density Category b: There are scattered areas of
fibroglandular density.
FINDINGS: There are no findings suspicious for malignancy.
IMPRESSION: No mammographic evidence of malignancy. A result letter of this
screening mammogram will be mailed directly to the patient.

RECOMMENDATION:
Screening mammogram in one year. (Code:51-O-LD2)

BI-RADS CATEGORY  1: Negative.

## 2021-11-15 ENCOUNTER — Other Ambulatory Visit: Payer: Self-pay | Admitting: Obstetrics and Gynecology

## 2021-11-15 DIAGNOSIS — Z1231 Encounter for screening mammogram for malignant neoplasm of breast: Secondary | ICD-10-CM

## 2021-12-15 ENCOUNTER — Ambulatory Visit
Admission: RE | Admit: 2021-12-15 | Discharge: 2021-12-15 | Disposition: A | Discharge status: Home / Self Care | Payer: BC Managed Care – PPO | Source: Ambulatory Visit | Attending: Obstetrics and Gynecology | Admitting: Obstetrics and Gynecology

## 2021-12-15 DIAGNOSIS — Z1231 Encounter for screening mammogram for malignant neoplasm of breast: Secondary | ICD-10-CM | POA: Insufficient documentation

## 2021-12-21 ENCOUNTER — Other Ambulatory Visit: Payer: Self-pay | Admitting: Obstetrics and Gynecology

## 2021-12-21 DIAGNOSIS — N6489 Other specified disorders of breast: Secondary | ICD-10-CM

## 2021-12-21 DIAGNOSIS — R928 Other abnormal and inconclusive findings on diagnostic imaging of breast: Secondary | ICD-10-CM

## 2023-09-18 ENCOUNTER — Encounter: Payer: Self-pay | Admitting: Podiatry

## 2023-09-18 ENCOUNTER — Ambulatory Visit (INDEPENDENT_AMBULATORY_CARE_PROVIDER_SITE_OTHER)

## 2023-09-18 ENCOUNTER — Ambulatory Visit: Admitting: Podiatry

## 2023-09-18 DIAGNOSIS — M7751 Other enthesopathy of right foot: Secondary | ICD-10-CM

## 2023-09-18 DIAGNOSIS — M779 Enthesopathy, unspecified: Secondary | ICD-10-CM

## 2023-09-18 DIAGNOSIS — M2141 Flat foot [pes planus] (acquired), right foot: Secondary | ICD-10-CM

## 2023-09-18 MED ORDER — MELOXICAM 15 MG PO TABS: 15.0000 mg | ORAL_TABLET | Freq: Every day | ORAL | 1 refills | Status: AC

## 2023-09-18 MED ORDER — BETAMETHASONE SOD PHOS & ACET 6 (3-3) MG/ML IJ SUSP
3.0000 mg | Freq: Once | INTRAMUSCULAR | Status: AC
  Administered 2023-09-18: 3 mg via INTRA_ARTICULAR

## 2023-09-18 MED ORDER — METHYLPREDNISOLONE 4 MG PO TBPK: ORAL_TABLET | ORAL | 0 refills | Status: AC

## 2023-09-18 NOTE — Patient Instructions (Signed)
 Orthotics code for insurance : [id number]  [employer] -insoles -OOFOS recovery slides

## 2023-09-18 NOTE — Progress Notes (Addendum)
   Chief Complaint  Patient presents with   Foot Pain    "If on my foot a whole lot, my right ankle just hurts." N - ankle pain L - lateral right D - 1 year O - off and on C - dull ache, sharp pain at times, tender A - being on my feet a lot T - heat    Subjective:  48 y.o. female presenting today as a new patient for evaluation of pain and tenderness associated to the right foot and ankle.  No history of injury.  Gradual onset.  Ongoing for about 1 year.  She works at [employer] on her feet all day concrete floors.  She has not done anything for treatment   Past Medical History:  Diagnosis Date   Allergy    GERD (gastroesophageal reflux disease)    History of chicken pox    History of migraine     Past Surgical History:  Procedure Laterality Date   WISDOM TOOTH EXTRACTION      Allergies  Allergen Reactions   Codeine     Headache really bad, throat feels really bad, "ears feel like they blowing off"    Objective / Physical Exam:  General:  The patient is alert and oriented x3 in no acute distress. Dermatology:  Skin is warm, dry and supple bilateral lower extremities. Negative for open lesions or macerations. Vascular:  Palpable pedal pulses bilaterally. No edema or erythema noted. Capillary refill within normal limits. 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  Pes planovalgus deformity noted.  There is tenderness with palpation to the anterior lateral aspect of the right ankle  Radiographic Exam RT ankle 09/18/2023:  Normal osseous mineralization. Joint spaces preserved. No fracture/dislocation/boney destruction.  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  Assessment: 1.  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  Capsulitis anterolateral aspect of the right ankle  Plan of Care:  -Patient evaluated.   X-rays reviewed -Injection of 0.5 cc Celestone Soluspan injected around the lateral aspect of the right ankle -Prescription for Medrol Dosepak -Prescription for meloxicam 15 mg daily after completion of the Dosepak -Advised against going barefoot.  Patient admits to walking around the house barefoot.  Recommend OOFOS recovery slides/sandals -Recommend arch supports.  We discussed custom versus prefabricated OTC arch supports.  Information provided for the patient to contact her insurance company to see if custom orthotics are a covered expense.  If they are she will contact her office and set up an appointment with our orthotics department -In the meantime recommend prefabricated insoles from [employer] -Return to clinic with me as needed  *Works at [employer], DPM [facility]  Dr. Felecia Shelling, DPM    [address] [place], [place] [address]                Office [phone]  Fax [phone]

## 2023-10-26 ENCOUNTER — Encounter: Payer: Self-pay | Admitting: Podiatry

## 2023-10-26 ENCOUNTER — Ambulatory Visit: Admitting: Podiatry

## 2023-10-26 DIAGNOSIS — M2141 Flat foot [pes planus] (acquired), right foot: Secondary | ICD-10-CM

## 2023-10-26 NOTE — Progress Notes (Signed)
   Chief Complaint  Patient presents with   Flat Foot    "I went to [employer] and got the insoles.  They have helped a little."    Subjective:  48 y.o. female presenting today for follow-up evaluation of right foot and ankle pain.  Overall significant improvement.  She says the arch supports helped significantly at work as well as wearing the OOFOS slides at home.  She does not go barefoot anymore.   Past Medical History:  Diagnosis Date   Allergy    GERD (gastroesophageal reflux disease)    History of chicken pox    History of migraine     Past Surgical History:  Procedure Laterality Date   WISDOM TOOTH EXTRACTION      Allergies  Allergen Reactions   Codeine     Headache really bad, throat feels really bad, "ears feel like they blowing off"    Objective / Physical Exam:  General:  The patient is alert and oriented x3 in no acute distress. Dermatology:  Skin is warm, dry and supple bilateral lower extremities. Negative for open lesions or macerations. Vascular:  Palpable pedal pulses bilaterally. No edema or erythema noted. Capillary refill within normal limits. 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  Pes planovalgus deformity noted.  Significant improvement to the tenderness to palpation to the anterior lateral aspect of the right ankle  Radiographic Exam RT ankle 09/18/2023:  Normal osseous mineralization. Joint spaces preserved. No fracture/dislocation/boney destruction.  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  Assessment: 1.  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  Capsulitis anterolateral aspect of the right ankle  Plan of Care:  -Patient evaluated.  Overall significant improvement - Continue meloxicam  15 mg daily as needed - Continue OOFOS recovery slides/sandals.  Patient states that she  wears them daily and they help significantly -Continue arch supports with [employer] running shoes that she purchased from [employer] running store -Compression ankle sleeves also dispensed to wear during her work shift -Return to clinic with me as needed  *Works at [employer], DPM [facility]  Dr. Dot Gazella, DPM    [address] [place], [place] [address]                Office [phone]  Fax [phone]

## 2024-03-20 ENCOUNTER — Ambulatory Visit: Admitting: Podiatry

## 2024-03-20 DIAGNOSIS — M2141 Flat foot [pes planus] (acquired), right foot: Secondary | ICD-10-CM

## 2024-03-20 DIAGNOSIS — M7751 Other enthesopathy of right foot: Secondary | ICD-10-CM

## 2024-03-20 NOTE — Progress Notes (Signed)
   Chief Complaint  Patient presents with   Injections    Subjective:  48 y.o. female presenting today as a new patient for evaluation of pain and tenderness associated to the right foot and ankle.  She states the injection that Dr. Janit gave her made her feel a lot better.  She wants to discuss another injection.  She denies any other acute complaints.   Past Medical History:  Diagnosis Date   Allergy    GERD (gastroesophageal reflux disease)    History of chicken pox    History of migraine     Past Surgical History:  Procedure Laterality Date   WISDOM TOOTH EXTRACTION      Allergies  Allergen Reactions   Codeine     Headache really bad, throat feels really bad, ears feel like they blowing off    Objective / Physical Exam:  General:  The patient is alert and oriented x3 in no acute distress. Dermatology:  Skin is warm, dry and supple bilateral lower extremities. Negative for open lesions or macerations. Vascular:  Palpable pedal pulses bilaterally. No edema or erythema noted. Capillary refill within normal limits. 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  Pes planovalgus deformity noted.  There is tenderness with palpation to the anterior lateral aspect of the right ankle  Radiographic Exam RT ankle 09/18/2023:  Normal osseous mineralization. Joint spaces preserved. No fracture/dislocation/boney destruction.  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  Assessment: 1.  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  Capsulitis anterolateral aspect of the right ankle  Plan of Care:  -Patient evaluated.  X-rays reviewed -.A steroid injection was performed at right lateral ankle using 1% plain Lidocaine and 10 mg of Kenalog. This was well tolerated. -Shoe gear modification discussed -Recommend arch supports.   We discussed custom versus prefabricated OTC arch supports.  Information provided for the patient to contact her insurance company to see if custom orthotics are a covered expense.  If they are she will contact her office and set up an appointment with our orthotics department -In the meantime recommend prefabricated insoles from [employer] -Return to clinic with me as needed  *Works at [employer]
